# Patient Record
Sex: Male | Born: 1980 | Race: Black or African American | Hispanic: No | Marital: Married | Smoking: Never smoker
Health system: Southern US, Community
[De-identification: ages and names within clinical notes are randomized; demographics above are authoritative.]

## PROBLEM LIST (undated history)

## (undated) DIAGNOSIS — M069 Rheumatoid arthritis, unspecified: Secondary | ICD-10-CM

## (undated) DIAGNOSIS — G629 Polyneuropathy, unspecified: Secondary | ICD-10-CM

## (undated) HISTORY — DX: Polyneuropathy, unspecified: G62.9

## (undated) HISTORY — DX: Rheumatoid arthritis, unspecified: M06.9

---

## 2000-05-27 ENCOUNTER — Emergency Department (HOSPITAL_COMMUNITY): Admission: EM | Admit: 2000-05-27 | Discharge: 2000-05-27 | Payer: Self-pay | Admitting: Emergency Medicine

## 2001-04-30 ENCOUNTER — Emergency Department (HOSPITAL_COMMUNITY): Admission: EM | Admit: 2001-04-30 | Discharge: 2001-04-30 | Payer: Self-pay | Admitting: Emergency Medicine

## 2001-05-08 ENCOUNTER — Emergency Department (HOSPITAL_COMMUNITY): Admission: EM | Admit: 2001-05-08 | Discharge: 2001-05-08 | Payer: Self-pay | Admitting: *Deleted

## 2002-11-20 ENCOUNTER — Emergency Department (HOSPITAL_COMMUNITY): Admission: EM | Admit: 2002-11-20 | Discharge: 2002-11-20 | Payer: Self-pay | Admitting: Emergency Medicine

## 2003-12-07 ENCOUNTER — Emergency Department (HOSPITAL_COMMUNITY): Admission: EM | Admit: 2003-12-07 | Discharge: 2003-12-07 | Payer: Self-pay | Admitting: Emergency Medicine

## 2007-10-12 ENCOUNTER — Emergency Department (HOSPITAL_COMMUNITY): Admission: EM | Admit: 2007-10-12 | Discharge: 2007-10-12 | Payer: Self-pay | Admitting: Emergency Medicine

## 2010-11-27 ENCOUNTER — Emergency Department (HOSPITAL_COMMUNITY)
Admission: EM | Admit: 2010-11-27 | Discharge: 2010-11-27 | Disposition: A | Discharge status: Home / Self Care | Payer: BC Managed Care – PPO | Attending: Emergency Medicine | Admitting: Emergency Medicine

## 2010-11-27 DIAGNOSIS — M109 Gout, unspecified: Secondary | ICD-10-CM | POA: Insufficient documentation

## 2010-11-27 LAB — POCT I-STAT, CHEM 8
BUN: 12 mg/dL (ref 6–23)
Calcium, Ion: 1.09 mmol/L — ABNORMAL LOW (ref 1.12–1.32)
Chloride: 104 mEq/L (ref 96–112)
Creatinine, Ser: 1.2 mg/dL (ref 0.4–1.5)
Glucose, Bld: 107 mg/dL — ABNORMAL HIGH (ref 70–99)
HCT: 46 % (ref 39.0–52.0)

## 2011-09-01 ENCOUNTER — Emergency Department (INDEPENDENT_AMBULATORY_CARE_PROVIDER_SITE_OTHER): Payer: BC Managed Care – PPO

## 2011-09-01 ENCOUNTER — Emergency Department (HOSPITAL_COMMUNITY)
Admission: EM | Admit: 2011-09-01 | Discharge: 2011-09-01 | Disposition: A | Discharge status: Home / Self Care | Payer: BC Managed Care – PPO | Source: Home / Self Care | Attending: Emergency Medicine | Admitting: Emergency Medicine

## 2011-09-01 DIAGNOSIS — S93409A Sprain of unspecified ligament of unspecified ankle, initial encounter: Secondary | ICD-10-CM

## 2011-09-01 MED ORDER — IBUPROFEN 800 MG PO TABS: 800.0000 mg | ORAL_TABLET | Freq: Three times a day (TID) | ORAL | Status: AC

## 2011-09-01 NOTE — ED Notes (Signed)
States he missed last step 3 days ago, and twisted left ankle; c/o pain lateral aspect w marked swelling; denies other trauma. + dorsal pedal pulse; has used RICE w/o improvement

## 2011-09-01 NOTE — ED Provider Notes (Signed)
History     CSN: [id number]  Arrival date & time 09/01/11  0909   First MD Initiated Contact with Patient 09/01/11 1013      Chief Complaint  Patient presents with  . Leg Injury    (Consider location/radiation/quality/duration/timing/severity/associated sxs/prior treatment) HPI Comments: Missed a last step and twisted my left ankle, been hurting and getting swollen for the last 3 days, hurst to walk on it  Patient is a 31 y.o. male presenting with ankle pain.  Ankle Pain  The incident occurred 2 days ago. The injury mechanism was torsion. The pain is present in the left ankle. The pain is at a severity of 6/10. The pain is moderate. The pain has been constant since onset. Associated symptoms include inability to bear weight and loss of motion. Pertinent negatives include no numbness, no muscle weakness, no loss of sensation and no tingling. The symptoms are aggravated by activity. He has tried NSAIDs for the symptoms. The treatment provided moderate relief.    History reviewed. No pertinent past medical history.  History reviewed. No pertinent past surgical history.  History reviewed. No pertinent family history.  History  Substance Use Topics  . Smoking status: Never Smoker   . Smokeless tobacco: Not on file  . Alcohol Use: No      Review of Systems  Musculoskeletal: Positive for joint swelling.  Neurological: Negative for tingling, weakness and numbness.    Allergies  Review of patient's allergies indicates no known allergies.  Home Medications   Current Outpatient Rx  Name Route Sig Dispense Refill  . IBUPROFEN 800 MG PO TABS Oral Take 1 tablet (800 mg total) by mouth 3 (three) times daily. 21 tablet 0    BP 134/71  Pulse 80  Temp(Src) 98.1 F (36.7 C) (Oral)  Resp 17  SpO2 97%  Physical Exam  Nursing note and vitals reviewed. Constitutional: He appears well-developed and well-nourished.  Musculoskeletal:       Left ankle: He exhibits decreased range  of motion and swelling. He exhibits no deformity. tenderness. Lateral malleolus tenderness found. No head of 5th metatarsal tenderness found.       Feet:  Neurological: He is alert.  Skin: No erythema.    ED Course  Procedures (including critical care time)  Labs Reviewed - No data to display Dg Ankle Complete Left  09/01/2011  *RADIOLOGY REPORT*  Clinical Data: Lateral ankle pain and swelling following twisting injury 5 days ago.  LEFT ANKLE COMPLETE - 3+ VIEW  Comparison: None.  Findings: There is marked lateral soft tissue swelling.  The mineralization and alignment are normal.  Minimal ossific density projects over the talofibular joint on the AP view.  This does not appear acute.  There is no evidence of acute fracture or dislocation.  Small plantar calcaneal spur is noted.  IMPRESSION: Lateral soft tissue swelling.  No acute osseous findings.  Original Report Authenticated By: Gerrianne Scale, M.D.     1. Ankle sprain and strain       MDM  Ankle stain 3 days        Jimmie Molly, MD 09/01/11 [phone]

## 2013-06-20 IMAGING — CR DG ANKLE COMPLETE 3+V*L*
3 series · 3 of 3 positions shown · non-contrast
Comparison: None.

CLINICAL DATA: Lateral ankle pain and swelling following twisting
injury 5 days ago.

LEFT ANKLE COMPLETE - 3+ VIEW

[view not recorded (1 of 3)]
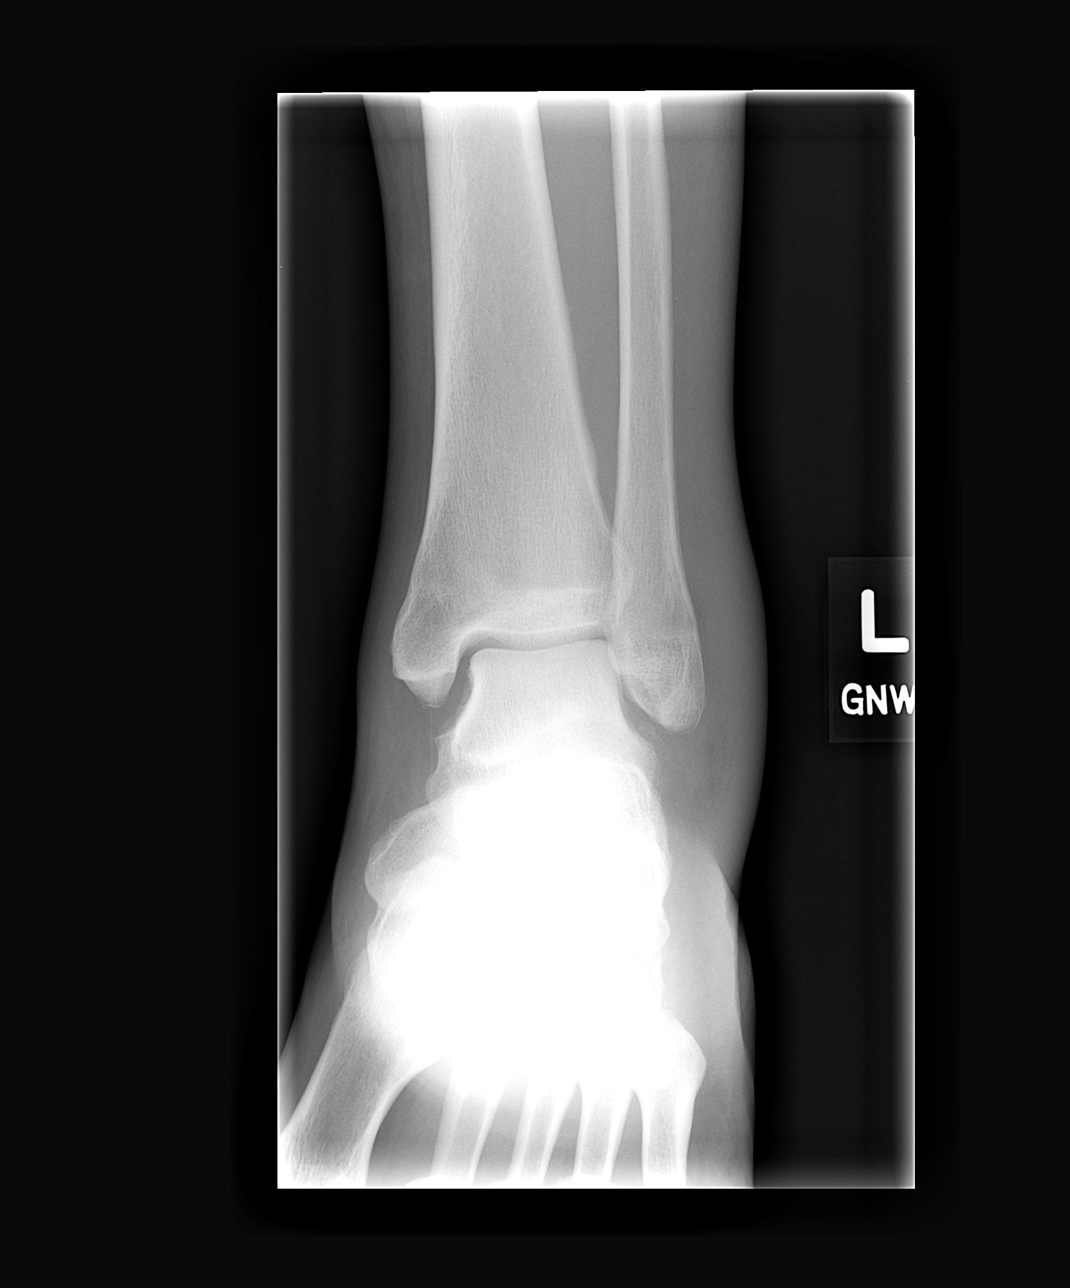

[view not recorded (2 of 3)]
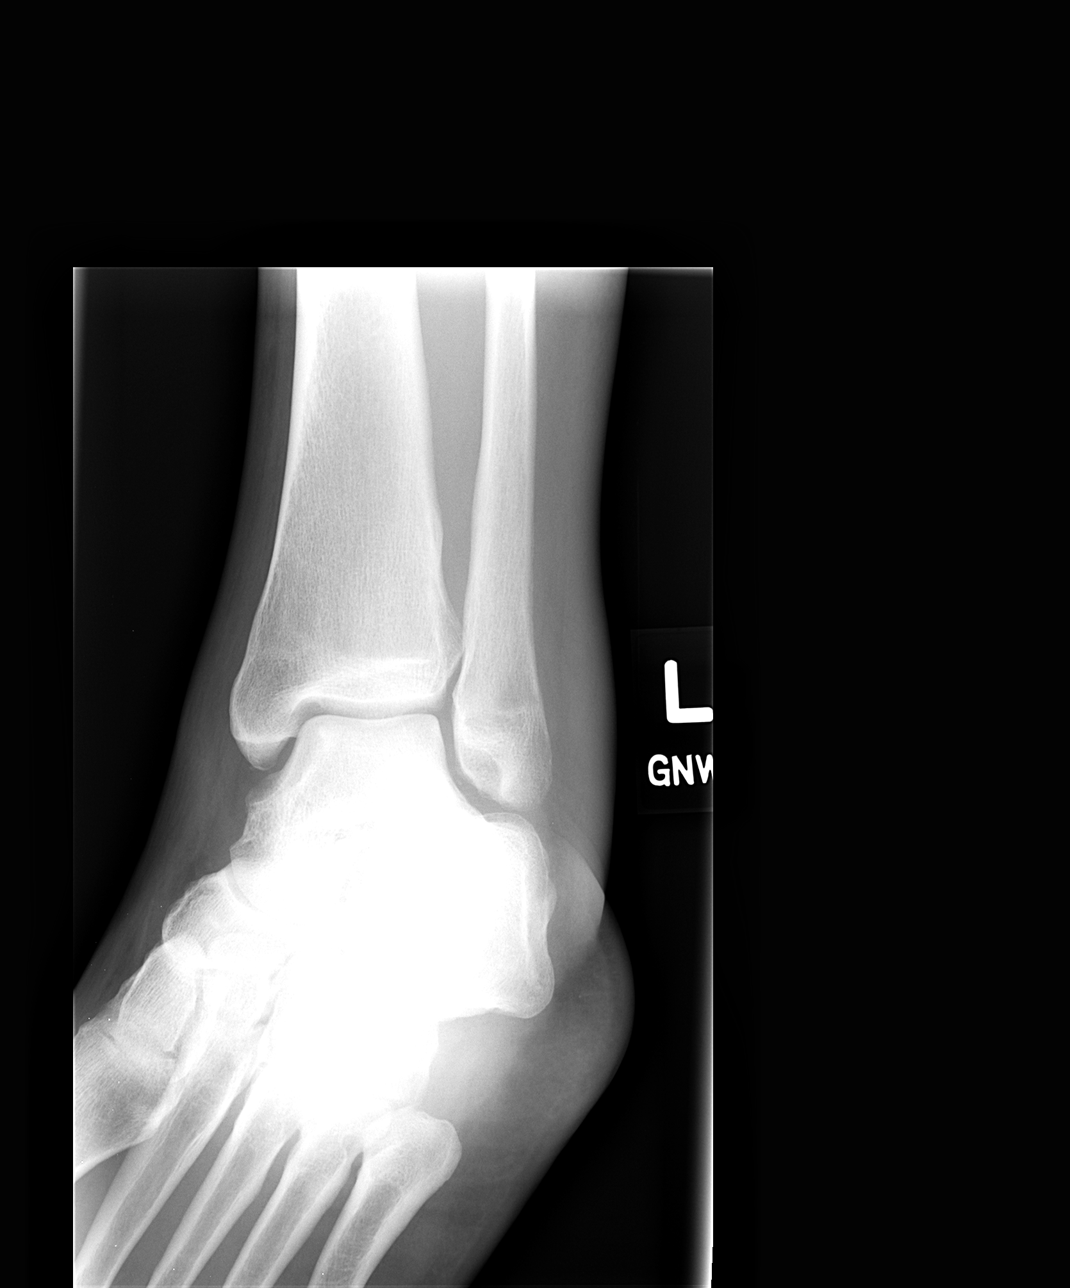

[view not recorded (3 of 3)]
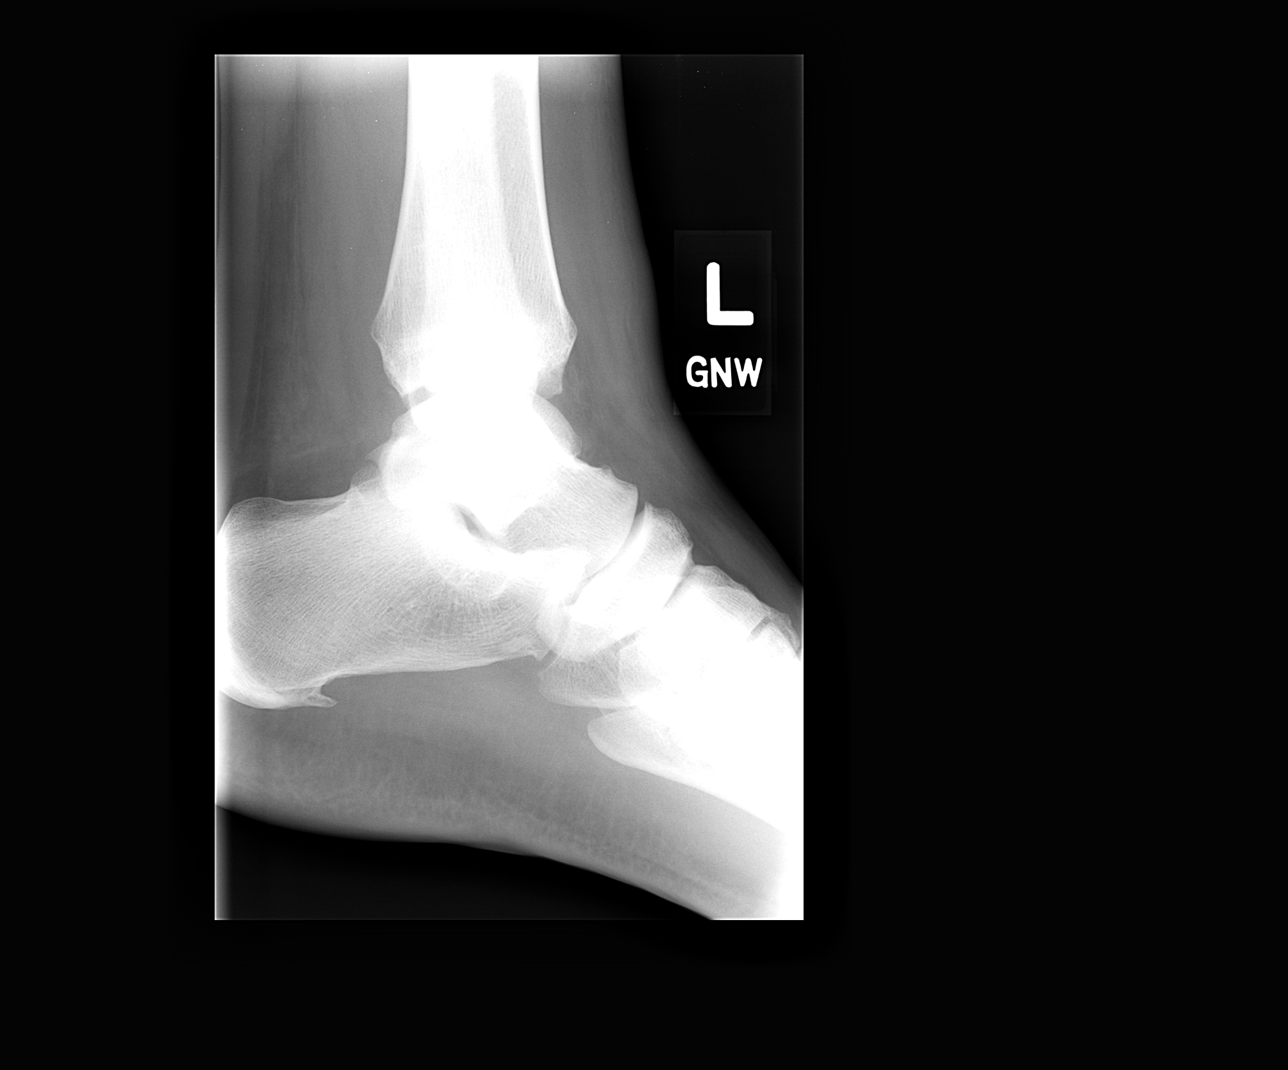

[3 of 3 positions shown; findings below may reference images not displayed]

FINDINGS: There is marked lateral soft tissue swelling.  The
mineralization and alignment are normal.  Minimal ossific density
projects over the talofibular joint on the AP view.  This does not
appear acute.  There is no evidence of acute fracture or
dislocation.  Small plantar calcaneal spur is noted.
IMPRESSION: Lateral soft tissue swelling.  No acute osseous findings.

## 2020-12-30 NOTE — Progress Notes (Signed)
NEUROLOGY CONSULTATION NOTE  Mark Gay MRN: [id number] DOB: [birth year]  Referring provider: Elpidio Anis, PA-C Primary care provider: [facility]  Reason for consult:  Neuropathy, Lhermitte's sign  Assessment/Plan:   1.  Paresthesias involving upper and lower extremities - concern for cervical myelopathy - rule out spinal stenosis, mass lesion or demyelinating disease.  He does have rheumatoid arthritis and therefore may have an additional autoimmune disease affecting the CNS. 2.  Rheumatoid arthritis.  1.  MRI of brain and cervical spine with and without contrast 2.  Further recommendations pending results.  Follow up after testing.  Subjective:  Mark Gay is a 40 year old right-handed male with rheumatoid arthritis who present for neuropathy and Lhermitte's sign.  History supplemented by referring provider's note.  Patient has rheumatoid arthritis/gouty arthritis, treated with Enebrel, methotrexate and allopurinol.  Beginning about 3 months ago, he started feeling pins and needles sensation in his right hand.  He would shake it out but it never resolved.  It involves the last three digits and started radiating up the ulnar aspect of his forearm and then above the elbow to the shoulder.  He then noticed numbness and tingling involving the lateral right leg that may radiate to involve the lateral and dorsal aspect of his foot and toes.  Over the past month, he started experiencing paresthesias in his left hand as well.  Sometimes his right upper extremity will cramp and contract, flexed at the elbow, wrist and fingers.  When he extends his neck and looks up with his arms raised, the paresthesias are aggravated.  He has some localized pain in the posterior neck at the C7 level.  No radicular pain down the extremities.  No weakness.  No bowel or bladder dysfunction.  No involvement of the head and face.  No visual disturbance.  Now bowel or bladder  dysfunction.  Due to concerns of possible demyelinating disease, Enebrel was discontinued but his polyarthralgias worsened, so he restarted it. He works for [employer] but does not do heavy lifting.  No preceding injury.    Rheumatoid arthritis runs in his family - his grandfather and mother.  No known family history of MS.   Labs from April were unremarkable including B12 389, TSH 1.980, CBC and CMP.   PAST MEDICAL HISTORY: Rheumatoid arthritis  PAST SURGICAL HISTORY: History reviewed. No pertinent surgical history.   MEDICATIONS: Current Outpatient Medications on File Prior to Visit  Medication Sig Dispense Refill  . celecoxib (CELEBREX) 200 MG capsule Take 200 mg by mouth daily.    Elgie Collard MINI 50 MG/ML SOCT Inject 500 mg into the skin once a week. Every Monday     No current facility-administered medications on file prior to visit.     ALLERGIES: No Known Allergies  FAMILY HISTORY: History reviewed. No pertinent family history.  Objective:  Blood pressure 130/83, pulse 73, height 6\' 2"  (1.88 m), weight 264 lb 6.4 oz (119.9 kg), SpO2 97 %. General: No acute distress.  Patient appears well-groomed.   Head:  Normocephalic/atraumatic Eyes:  fundi examined but not visualized Neck: supple, no paraspinal tenderness, full range of motion Back: No paraspinal tenderness Heart: regular rate and rhythm Lungs: Clear to auscultation bilaterally. Vascular: No carotid bruits. Neurological Exam: Mental status: alert and oriented to person, place, and time; speech fluent and not dysarthric, language intact. Cranial nerves: CN I: not tested CN II: pupils equal, round and reactive to light, visual fields intact CN III, IV,  VI:  full range of motion, no nystagmus, no ptosis CN V: facial sensation intact. CN VII: upper and lower face symmetric CN VIII: hearing intact CN IX, X: gag intact, uvula midline CN XI: sternocleidomastoid and trapezius muscles intact CN XII: tongue midline Bulk &  Tone: normal, no fasciculations. Motor:  muscle strength 5/5 throughout Sensation:  Pinprick sensation reduced over last 3 digits of right hand, ulnar aspect of right hand and forearm, entire right proximal arm above the elbow, dorsal and lateral aspect of right foot and entire right leg.  Vibratory sensation intact.   Deep Tendon Reflexes:  2+ throughout,  toes downgoing.  Questionable slight Hoffman's bilaterally. Finger to nose testing:  Without dysmetria.   Heel to shin:  Without dysmetria.   Gait:  Normal station and stride.  Able to turn, tandem walk, and walk on toes and heals.  Romberg negative.    Thank you for allowing me to take part in the care of this patient.  Shon Millet, DO  CC:  Elpidio Anis, PA-C

## 2020-12-31 ENCOUNTER — Other Ambulatory Visit: Payer: Self-pay

## 2020-12-31 ENCOUNTER — Encounter: Payer: Self-pay | Admitting: Neurology

## 2020-12-31 ENCOUNTER — Ambulatory Visit: Payer: BC Managed Care – PPO | Admitting: Neurology

## 2020-12-31 VITALS — BP 130/83 | HR 73 | Ht 74.0 in | Wt 264.4 lb

## 2020-12-31 DIAGNOSIS — G379 Demyelinating disease of central nervous system, unspecified: Secondary | ICD-10-CM | POA: Diagnosis not present

## 2020-12-31 DIAGNOSIS — R202 Paresthesia of skin: Secondary | ICD-10-CM

## 2020-12-31 DIAGNOSIS — G959 Disease of spinal cord, unspecified: Secondary | ICD-10-CM | POA: Diagnosis not present

## 2020-12-31 DIAGNOSIS — R200 Anesthesia of skin: Secondary | ICD-10-CM | POA: Diagnosis not present

## 2020-12-31 NOTE — Patient Instructions (Signed)
Check MRI of brain and cervical spine with and without contrast Further recommendations pending results.

## 2021-01-13 ENCOUNTER — Telehealth: Payer: Self-pay | Admitting: Neurology

## 2021-01-13 ENCOUNTER — Other Ambulatory Visit: Payer: Self-pay

## 2021-01-13 DIAGNOSIS — G379 Demyelinating disease of central nervous system, unspecified: Secondary | ICD-10-CM

## 2021-01-13 DIAGNOSIS — G959 Disease of spinal cord, unspecified: Secondary | ICD-10-CM

## 2021-01-13 DIAGNOSIS — R202 Paresthesia of skin: Secondary | ICD-10-CM

## 2021-01-13 DIAGNOSIS — R200 Anesthesia of skin: Secondary | ICD-10-CM

## 2021-01-13 NOTE — Telephone Encounter (Signed)
New message    Need a sign MRI order of brain / cervical spine with / without contrast from MD   Appt 5.19.22 @ 9:00am   They received the clinic notes   Please advise.

## 2021-01-13 NOTE — Telephone Encounter (Signed)
Order faxed over through Epic as well via fax machine.

## 2021-01-13 NOTE — Progress Notes (Signed)
Telephone call from [facility] imaging on [facility], Pt would like to have  His imaging there instead of [facility].  New orders added and faxed back [phone] per rep at [facility].

## 2021-01-17 ENCOUNTER — Telehealth: Payer: Self-pay | Admitting: Neurology

## 2021-01-17 DIAGNOSIS — G959 Disease of spinal cord, unspecified: Secondary | ICD-10-CM

## 2021-01-17 DIAGNOSIS — R202 Paresthesia of skin: Secondary | ICD-10-CM

## 2021-01-17 DIAGNOSIS — R200 Anesthesia of skin: Secondary | ICD-10-CM

## 2021-01-17 NOTE — Addendum Note (Signed)
Addended by: Leida Lauth on: 01/17/2021 04:24 PM   Modules accepted: Orders

## 2021-01-17 NOTE — Telephone Encounter (Signed)
Pt advised.   Order faxed to [facility].

## 2021-01-17 NOTE — Telephone Encounter (Signed)
MRI of brain is normal.  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  Please refer patient to neurosurgery.

## 2021-09-11 ENCOUNTER — Ambulatory Visit
Admission: EM | Admit: 2021-09-11 | Discharge: 2021-09-11 | Disposition: A | Discharge status: Home / Self Care | Payer: BC Managed Care – PPO

## 2021-09-11 ENCOUNTER — Ambulatory Visit (INDEPENDENT_AMBULATORY_CARE_PROVIDER_SITE_OTHER): Payer: BC Managed Care – PPO

## 2021-09-11 ENCOUNTER — Other Ambulatory Visit: Payer: Self-pay

## 2021-09-11 ENCOUNTER — Encounter: Payer: Self-pay | Admitting: Emergency Medicine

## 2021-09-11 DIAGNOSIS — S92415A Nondisplaced fracture of proximal phalanx of left great toe, initial encounter for closed fracture: Secondary | ICD-10-CM

## 2021-09-11 DIAGNOSIS — S92425A Nondisplaced fracture of distal phalanx of left great toe, initial encounter for closed fracture: Secondary | ICD-10-CM | POA: Diagnosis not present

## 2021-09-11 NOTE — ED Triage Notes (Addendum)
Fell 1.5 weeks ago, left toe bruised initially. Now toe is very swollen and painful. States he felt a snap initially, but just thought he had popped his toe. Also complaining of pain across the bases of all toes

## 2021-09-11 NOTE — ED Provider Notes (Signed)
[facility]    CSN: [id number] Arrival date & time: 09/11/21  0805      History   Chief Complaint Chief Complaint  Patient presents with   Fall   Leg Injury    HPI Mark Gay is a 41 y.o. male.   Patient presents with left toe/foot pain that started approximately 1.5 weeks ago after an injury.  He reports that he was working on his car when he fell forward and hyperextended his toes on his left foot.  He is able to bear weight but with pain.  He denies pain in the foot, and the majority of the pain is located in the left great toe but does extend to the smaller toes as well.  Denies any numbness or tingling.   Fall   Past Medical History:  Diagnosis Date   Neuropathy    Rheumatoid arthritis (HCC)     There are no problems to display for this patient.   History reviewed. No pertinent surgical history.     Home Medications    Prior to Admission medications   Medication Sig Start Date End Date Taking? Authorizing Provider  ENBREL MINI 50 MG/ML SOCT Inject 500 mg into the skin once a week. Every Monday 10/27/20  Yes [provider]  Methotrexate, PF, (RASUVO) 20 MG/0.4ML SOAJ INJECT ONE PEN UNDER THE SKIN ONCE EVERY WEEK. STORE AT ROOM TEMPERATURE BETWEEN 68 - 77 DEGREES F. 10/27/20  Yes [provider]  celecoxib (CELEBREX) 200 MG capsule Take 200 mg by mouth daily. 12/27/20   [provider]    Family History History reviewed. No pertinent family history.  Social History Social History   Tobacco Use   Smoking status: Never   Smokeless tobacco: Never  Substance Use Topics   Alcohol use: Yes    Comment: Occ.   Drug use: No     Allergies   Patient has no known allergies.   Review of Systems Review of Systems Per HPI  Physical Exam Triage Vital Signs ED Triage Vitals  Enc Vitals Group     BP 09/11/21 0822 135/89     Pulse Rate 09/11/21 0822 74     Resp 09/11/21 0822 16     Temp 09/11/21 0822 98.1 F (36.7  C)     Temp Source 09/11/21 0822 Oral     SpO2 09/11/21 0822 95 %     Weight --      Height --      Head Circumference --      Peak Flow --      Pain Score 09/11/21 0824 8     Pain Loc --      Pain Edu? --      Excl. in GC? --    No data found.  Updated Vital Signs BP 135/89 (BP Location: Left Arm)    Pulse 74    Temp 98.1 F (36.7 C) (Oral)    Resp 16    SpO2 95%   Visual Acuity Right Eye Distance:   Left Eye Distance:   Bilateral Distance:    Right Eye Near:   Left Eye Near:    Bilateral Near:     Physical Exam Constitutional:      General: He is not in acute distress.    Appearance: Normal appearance. He is not toxic-appearing or diaphoretic.  HENT:     Head: Normocephalic and atraumatic.  Eyes:     Extraocular Movements: Extraocular movements intact.  Conjunctiva/sclera: Conjunctivae normal.  Pulmonary:     Effort: Pulmonary effort is normal.  Musculoskeletal:     Right foot: Normal.     Left foot: Decreased range of motion. Normal capillary refill. Swelling, tenderness and bony tenderness present. No crepitus. Normal pulse.     Comments: Tenderness to palpation to left great toe and second and third toes of left foot.  Bruising noted to dorsal surface of left great toe with mild swelling.  Neurovascular intact.  Limited range of motion of toes given pain.  Capillary refill normal.  Neurological:     General: No focal deficit present.     Mental Status: He is alert and oriented to person, place, and time. Mental status is at baseline.  Psychiatric:        Mood and Affect: Mood normal.        Behavior: Behavior normal.        Thought Content: Thought content normal.        Judgment: Judgment normal.     UC Treatments / Results  Labs (all labs ordered are listed, but only abnormal results are displayed) Labs Reviewed - No data to display  EKG   Radiology DG Foot Complete Left  Result Date: 09/11/2021 CLINICAL DATA:  Fall 1.5 weeks prior with  persistent left first toe bruising, swelling and pain EXAM: LEFT FOOT - COMPLETE 3+ VIEW COMPARISON:  None. FINDINGS: Comminuted nondisplaced intra-articular fracture of the distal aspect of the proximal phalanx in the left first toe with surrounding soft tissue swelling. No additional fracture. No dislocation. Lisfranc joint appears intact. Small to moderate plantar left calcaneal spur. Mild first MTP joint osteoarthritis. No radiopaque foreign bodies. IMPRESSION: 1. Comminuted nondisplaced intra-articular fracture of the distal aspect of the proximal phalanx in the left first toe. No dislocation. 2. Small to moderate plantar left calcaneal spur. 3. Mild first MTP joint osteoarthritis. Electronically Signed   By: Ilona Sorrel M.D.   On: 09/11/2021 08:53    Procedures Procedures (including critical care time)  Medications Ordered in UC Medications - No data to display  Initial Impression / Assessment and Plan / UC Course  I have reviewed the triage vital signs and the nursing notes.  Pertinent labs & imaging results that were available during my care of the patient were reviewed by me and considered in my medical decision making (see chart for details).     Left foot x-ray did show fracture of left great toe. Dr. Mardelle Matte (on-call orthopedist) was called to consult given location of fracture and comminuted characteristic.  Orthopedist advised that he does not need to be seen as soon as possible but he can follow-up in the next few days outpatient.  Postop shoe applied due to fracture being in left great toe.  Advised supportive care and ice application.  Patient to follow-up with provided contact information for orthopedist.  Discussed strict return precautions.  Patient verbalized understanding was agreeable with plan. Final Clinical Impressions(s) / UC Diagnoses   Final diagnoses:  Closed nondisplaced fracture of distal phalanx of left great toe, initial encounter     Discharge Instructions       You have a fracture of your left big toe.  Please follow-up with orthopedist for further evaluation and management.     ED Prescriptions   None    PDMP not reviewed this encounter.   Teodora Medici, [place] 09/11/21 [phone]

## 2021-09-11 NOTE — Discharge Instructions (Signed)
You have a fracture of your left big toe.  Please follow-up with orthopedist for further evaluation and management.

## 2021-09-15 ENCOUNTER — Ambulatory Visit (HOSPITAL_BASED_OUTPATIENT_CLINIC_OR_DEPARTMENT_OTHER): Payer: BC Managed Care – PPO | Admitting: Orthopaedic Surgery

## 2021-09-16 ENCOUNTER — Other Ambulatory Visit (HOSPITAL_BASED_OUTPATIENT_CLINIC_OR_DEPARTMENT_OTHER): Payer: Self-pay

## 2021-09-16 ENCOUNTER — Ambulatory Visit (HOSPITAL_BASED_OUTPATIENT_CLINIC_OR_DEPARTMENT_OTHER): Payer: BC Managed Care – PPO | Admitting: Orthopaedic Surgery

## 2021-09-16 ENCOUNTER — Other Ambulatory Visit: Payer: Self-pay

## 2021-09-16 DIAGNOSIS — M21619 Bunion of unspecified foot: Secondary | ICD-10-CM | POA: Diagnosis not present

## 2021-09-16 DIAGNOSIS — S92412A Displaced fracture of proximal phalanx of left great toe, initial encounter for closed fracture: Secondary | ICD-10-CM | POA: Diagnosis not present

## 2021-09-16 MED ORDER — MELOXICAM 15 MG PO TABS
15.0000 mg | ORAL_TABLET | Freq: Every day | ORAL | 0 refills | Status: AC
  Filled 2021-09-16: qty 30, 30d supply, fill #0

## 2021-09-16 NOTE — Progress Notes (Signed)
Chief Complaint: left proximal phalanx fracture     History of Present Illness:    Mark Gay is a 41 y.o. male presents with a left proximal phalanx fracture that occurred on 15 January of this year when he was cleaning his car and tripped and slipped.  He felt a snap in the left toe at that point.  He does have a history of rheumatoid arthritis.  He was given a postop shoe at the urgent care which he says helped significantly.  At this time he has been taking ibuprofen.  That helped significantly with pain.  He works with [employer] and has had to be out of work at this time as result of this injury    Surgical History:   None  PMH/PSH/Family History/Social History/Meds/Allergies:    Past Medical History:  Diagnosis Date   Neuropathy    Rheumatoid arthritis (HCC)    No past surgical history on file. Social History   Socioeconomic History   Marital status: Married    Spouse name: Not on file   Number of children: Not on file   Years of education: Not on file   Highest education level: Not on file  Occupational History   Not on file  Tobacco Use   Smoking status: Never   Smokeless tobacco: Never  Substance and Sexual Activity   Alcohol use: Yes    Comment: Occ.   Drug use: No   Sexual activity: Not on file  Other Topics Concern   Not on file  Social History Narrative   Right handed   Social Determinants of Health   Financial Resource Strain: Not on file  Food Insecurity: Not on file  Transportation Needs: Not on file  Physical Activity: Not on file  Stress: Not on file  Social Connections: Not on file   No family history on file. No Known Allergies Current Outpatient Medications  Medication Sig Dispense Refill   celecoxib (CELEBREX) 200 MG capsule Take 200 mg by mouth daily.     ENBREL MINI 50 MG/ML SOCT Inject 500 mg into the skin once a week. Every Monday     Methotrexate, PF, (RASUVO) 20 MG/0.4ML SOAJ INJECT ONE PEN  UNDER THE SKIN ONCE EVERY WEEK. STORE AT ROOM TEMPERATURE BETWEEN 68 - 77 DEGREES F.     No current facility-administered medications for this visit.   No results found.  Review of Systems:   A ROS was performed including pertinent positives and negatives as documented in the HPI.  Physical Exam :   Constitutional: NAD and appears stated age Neurological: Alert and oriented Psych: Appropriate affect and cooperative There were no vitals taken for this visit.   Comprehensive Musculoskeletal Exam:    Tenderness to palpation about left great toe.  There is a baseline mild hallux valgus deformity.  Sensation is intact in all distributions of the left foot.  Tenderness with mobility.  He has a baseline right great toe hallux valgus deformity as well this is more severe in the left side  Imaging:   Xray (3 views left foot): Left minimally displaced proximal phalanx fracture with intra-articular extension  I personally reviewed and interpreted the radiographs.   Assessment:   41 year old male with left great toe proximal phalanx fracture.  At this time I will do believe the alignment  is quite good and that this will heal well without surgery.  I would recommend him continuing to weight-bear as tolerated on this left side.  He was advised on buddy taping.  I will also prescribe him Mobic as he does not like to take pills multiple times daily.  I will see him back in 2 weeks and we will reassess for possible return to work at that time  Plan :    -I will see him back in 2 weeks for reassessment and possible return to work     I personally saw and evaluated the patient, and participated in the management and treatment plan.  Huel Cote, MD Attending Physician, Orthopedic Surgery  This document was dictated using Dragon voice recognition software. A reasonable attempt at proof reading has been made to minimize errors.

## 2021-09-30 ENCOUNTER — Ambulatory Visit (HOSPITAL_BASED_OUTPATIENT_CLINIC_OR_DEPARTMENT_OTHER): Payer: BC Managed Care – PPO | Admitting: Orthopaedic Surgery

## 2021-09-30 ENCOUNTER — Other Ambulatory Visit (HOSPITAL_BASED_OUTPATIENT_CLINIC_OR_DEPARTMENT_OTHER): Payer: Self-pay | Admitting: Orthopaedic Surgery

## 2021-09-30 DIAGNOSIS — S92412A Displaced fracture of proximal phalanx of left great toe, initial encounter for closed fracture: Secondary | ICD-10-CM

## 2021-11-07 ENCOUNTER — Ambulatory Visit: Payer: BC Managed Care – PPO

## 2021-11-07 ENCOUNTER — Encounter: Payer: Self-pay | Admitting: Podiatrist

## 2021-11-07 ENCOUNTER — Other Ambulatory Visit: Payer: Self-pay

## 2021-11-07 ENCOUNTER — Ambulatory Visit: Payer: BC Managed Care – PPO | Admitting: Podiatrist

## 2021-11-07 ENCOUNTER — Ambulatory Visit (INDEPENDENT_AMBULATORY_CARE_PROVIDER_SITE_OTHER): Payer: BC Managed Care – PPO

## 2021-11-07 DIAGNOSIS — S99922A Unspecified injury of left foot, initial encounter: Secondary | ICD-10-CM

## 2021-11-07 DIAGNOSIS — M21611 Bunion of right foot: Secondary | ICD-10-CM

## 2021-11-07 DIAGNOSIS — M2041 Other hammer toe(s) (acquired), right foot: Secondary | ICD-10-CM

## 2021-11-07 DIAGNOSIS — S92912A Unspecified fracture of left toe(s), initial encounter for closed fracture: Secondary | ICD-10-CM

## 2021-11-07 DIAGNOSIS — M21619 Bunion of unspecified foot: Secondary | ICD-10-CM | POA: Diagnosis not present

## 2021-11-07 NOTE — Patient Instructions (Signed)
Toe Fracture ?A toe fracture is a break in one of the toe bones (phalanges). This may happen if you: ?Drop a heavy object on your toe. ?Stub your toe. ?Twist your toe. ?Exercise the same way too much. ?What are the signs or symptoms? ?The main symptoms are swelling and pain in the toe. You may also have: ?Bruising. ?Stiffness. ?Numbness. ?A change in the way the toe looks. ?Broken bones that poke through the skin. ?Blood under the toenail. ?How is this treated? ?Treatments may include: ?Taping the broken toe to a toe that is next to it (buddy taping). ?Wearing a shoe that has a wide, rigid sole to protect the toe and to limit its movement. ?Wearing a cast. ?Surgery. This may be needed if the: ?Pieces of broken bone are out of place. ?Bone pokes through the skin. ?Physical therapy. ?Follow these instructions at home: ?If you have a shoe: ?Wear the shoe as told by your doctor. Remove it only as told by your doctor. ?Loosen the shoe if your toes tingle, become numb, or turn cold and blue. ?Keep the shoe clean and dry. ? ?Managing pain, stiffness, and swelling ? ?Put ice on the injured area if told by your doctor: ?Put ice in a plastic bag. ?Place a towel between your skin and the bag. ?If you have a shoe, remove it as told by your doctor. ?If you have a cast, place a towel between your cast and the bag. ?Leave the ice on for 20 minutes, 2-3 times per day. ?Raise (elevate) the injured area above the level of your heart while you are sitting or lying down. ?General instructions ?If your toe was taped to a toe that is next to it, follow your doctor's instructions for changing the gauze and tape. Change it more often: ?If the gauze and tape get wet. If this happens, dry the space between the toes. ?If the gauze and tape are too tight and they cause your toe to become pale or to lose feeling (go numb). ?If your doctor did not give you a protective shoe, wear sturdy shoes that support your foot. Your shoes should not: ?Pinch  your toes. ?Fit tightly against your toes. ?Do not use any tobacco products, including cigarettes, chewing tobacco, or e-cigarettes. These can delay bone healing. If you need help quitting, ask your doctor. ?Take medicines only as told by your doctor. ?Keep all follow-up visits as told by your doctor. This is important. ?Contact a doctor if: ?Your pain medicine is not helping. ?You have a fever. ?You notice a bad smell coming from your cast. ?Get help right away if: ?You lose feeling (have numbness) in your toe or foot, and it is getting worse. ?Your toe or your foot tingles. ?Your toe or your foot gets cold or turns blue. ?You have redness or swelling in your toe or foot, and it is getting worse. ?You have very bad pain. ?Summary ?A toe fracture is a break in one of the toe bones. ?Use ice and raise your foot. This will help lessen pain and swelling. ?Use crutches as told by your doctor. ?This information is not intended to replace advice given to you by your health care provider. Make sure you discuss any questions you have with your health care provider. ?Document Revised: 01/17/2021 Document Reviewed: 01/17/2021 ?Elsevier Patient Education ? 2022 [employer] ? ?

## 2021-11-07 NOTE — Progress Notes (Signed)
?Chief Complaint  ?Patient presents with  ? Bunions  ?  Biltaeral bunion pain , patient states this has been ongoing for 2 years  ?  ? ?HPI: Patient is 41 y.o. male who presents today for 2 concerns the first concern was a fractured left hallux.  He relates he was cleaning his car and slipped and landed on his foot and his and his toe became bruised and painful.  He was seen on September 11, 2021 and was put in a surgical shoe.  He wore it for about 2 weeks and related that the swelling went down.  He does still have some pain in this left great toe from time to time.  He is currently not wearing a surgical shoe or boot on this foot.  He also has concern over a large bunion on the right foot which she would like to know about treatment options. ?He does have rheumatoid arthritis and is under the treatment by Dr. Azucena Fallen.  He is currently on Enbrel and methotrexate. ? ?There are no problems to display for this patient. ? ? ?Current Outpatient Medications on File Prior to Visit  ?Medication Sig Dispense Refill  ? celecoxib (CELEBREX) 200 MG capsule Take 200 mg by mouth daily.    ? ENBREL MINI 50 MG/ML SOCT Inject 500 mg into the skin once a week. Every Monday    ? meloxicam (MOBIC) 15 MG tablet Take 1 tablet (15 mg total) by mouth daily. 30 tablet 0  ? Methotrexate, PF, (RASUVO) 20 MG/0.4ML SOAJ INJECT ONE PEN UNDER THE SKIN ONCE EVERY WEEK. STORE AT ROOM TEMPERATURE BETWEEN 68 - 77 DEGREES F.    ? ?No current facility-administered medications on file prior to visit.  ? ? ?No Known Allergies ? ?Review of Systems ?No fevers, chills, nausea, muscle aches, no difficulty breathing, no calf pain, no chest pain or shortness of breath. ? ? ?Physical Exam ? ?GENERAL APPEARANCE: Alert, conversant. Appropriately groomed. No acute distress.  ? ?VASCULAR: Pedal pulses palpable DP and PT bilateral.  Capillary refill time is immediate to all digits,  Proximal to distal cooling it warm to warm.  Digital perfusion adequate.   ? ?NEUROLOGIC: sensation is intact to 5.07 monofilament at 5/5 sites bilateral.  Light touch is intact bilateral, vibratory sensation intact bilateral ? ?MUSCULOSKELETAL: Mild pain at the hallux interphalangeal joint left is noted with range of motion.  A moderate bunion deformity is noted on the left foot.  Right foot a large bunion deformity is noted with a contracture second digit hammertoe noted as well.  Range of motion is good with no pain or crepitus. ? ?DERMATOLOGIC: skin is warm, supple, and dry.  No open lesions noted.  No rash, no pre ulcerative lesions. Digital nails are asymptomatic.   ? ?X-ray evaluation 3 views of the left foot are obtained.  A fracture of the proximal phalanx of the left hallux is noted with a slightly displaced distal medial fracture fragment.  Callus is noted to be forming around the central fracture line of the hallux proximal phalanx.  Moderate bunion is also present on the left foot.  Joint spaces are good.  No signs of arthritic changes are noted. ? ?3 views of the right foot are also obtained.  Moderate to severe bunion deformity is noted with atavistic cuneiform noted as well.  Contracture of the second digit with hammertoe deformity is seen.  Joint spaces are good.  No sign of arthritic changes noted. ? ? ?Assessment  ? ?  ICD-10-CM   ?1. Bunion  M21.619 DG Foot Complete Right  ?  ?2. Injury of toe on left foot, initial encounter  F79.024O DG Foot Complete Left  ?  ?3. Fracture of proximal phalanx of toe of left foot  S92.912A   ?  ?4. Bunion, right  M21.611   ?  ?5. Hammertoe of second toe of right foot  M20.41   ?  ? ? ? ?Plan ? ?Discussed exam and x-ray findings with the patient.  Discussed that for the fracture I would recommend putting him in a boot to allow the fracture to consolidate and heal fully.  I discussed that for the bunion he would need surgery and I recommended he speak with Dr. Lilian Kapur.  Dr. Lilian Kapur did speak with the patient at today's visit and he will  see Ron back in 1 month for recheck of the fracture and for follow-up and for surgical planning for the bunion procedure on the right foot.  If any concerns arise prior to that visit he is instructed to call. ?

## 2021-11-07 NOTE — Progress Notes (Signed)
I saw and evaluated the patient today in conjunction with Dr. Irving Shows for a right foot bunion, second hammertoe and left hallux proximal phalanx fracture.  History of RA currently on Enbrel and Rasuvo.  Briefly reviewed his radiographs with him, I would recommend allowing the left hallux fracture to heal further prior to proceeding with bunion surgery.  I will see him back in 4 weeks for surgical planning visit.  Surgically we discussed a Lapidus arthrodesis.  He will need to plan for time off for his work and I estimated 3 to 4 months which should be sufficient.  We will discuss further at his next visit signed consent for paperwork and pick a surgical date as well as take new radiographs of the left hallux. ? ?Sharl Ma, DPM ?11/07/2021 ? ? ?

## 2021-12-05 ENCOUNTER — Ambulatory Visit: Payer: BC Managed Care – PPO | Admitting: Podiatry

## 2021-12-05 ENCOUNTER — Ambulatory Visit (INDEPENDENT_AMBULATORY_CARE_PROVIDER_SITE_OTHER): Payer: BC Managed Care – PPO

## 2021-12-05 DIAGNOSIS — M21961 Unspecified acquired deformity of right lower leg: Secondary | ICD-10-CM

## 2021-12-05 DIAGNOSIS — M2041 Other hammer toe(s) (acquired), right foot: Secondary | ICD-10-CM | POA: Diagnosis not present

## 2021-12-05 DIAGNOSIS — S92912A Unspecified fracture of left toe(s), initial encounter for closed fracture: Secondary | ICD-10-CM

## 2021-12-05 DIAGNOSIS — M2011 Hallux valgus (acquired), right foot: Secondary | ICD-10-CM | POA: Diagnosis not present

## 2021-12-05 DIAGNOSIS — M21611 Bunion of right foot: Secondary | ICD-10-CM

## 2021-12-05 NOTE — Progress Notes (Signed)
?  Subjective:  ?Patient ID: Mark Gay, male    DOB: Apr 29, 1981,  MRN: [id number] ? ?Chief Complaint  ?Patient presents with  ? Bunions  ? Fracture  ?   toe fracture left (need xrays) and pre op bunion consult right.  ? ? ?41 y.o. male presents with the above complaint. History confirmed with patient.  Returns today for follow-up of the left hallux fracture as well as right foot hallux valgus and bunion deformity.  He is ready to have his right foot surgery scheduled.  The left foot continues to improve but is slow progress. ? ?Objective:  ?Physical Exam: ?warm, good capillary refill, no trophic changes or ulcerative lesions, normal DP and PT pulses, and normal sensory exam. ?Left Foot: bunion deformity noted and pain around the great toe ?Right Foot:  Hallux valgus with bunion deformity on the right side there is hallux deviation, second hammertoe that is significant with semirigid nature, adductovarus fifth toe contracture ? ?No images are attached to the encounter. ? ?Radiographs: ?Multiple views x-ray of both feet: New left foot radiographs taken today show increasing bone callus formation around his fracture site with improvement, right foot radiographs previously taken show a significant hallux valgus deformity with metatarsus primus varus hallux abductus, second hammertoe and elongated second metatarsal with adductovarus fifth toe contracture ?Assessment:  ? ?1. Fracture of proximal phalanx of toe of left foot   ?2. Hallux valgus with bunions, right   ?3. Hammertoe of right foot   ?4. Deformity of metatarsal bone of right foot   ? ? ? ?Plan:  ?Patient was evaluated and treated and all questions answered. ? ?For his left hallux fracture continues to improve I would recommend he continue WBAT in the cam boot until his pain is resolved.  We will take new radiographs at the next time I see him. ? ? ?Discussed the etiology and treatment including surgical and non surgical treatment for painful bunions and  hammertoes.  He has exhausted all non surgical treatment prior to this visit including shoe gear changes and padding.  He desires surgical intervention. We discussed all risks including but not limited to: pain, swelling, infection, scar, numbness which may be temporary or permanent, chronic pain, stiffness, nerve pain or damage, wound healing problems, bone healing problems including delayed or non-union and recurrence. Specifically we discussed the following procedures: Right foot Lapidus arthrodesis with bone graft from the heel, possible Akin osteotomy, hammertoe correction of the second and fifth toes with possible Weil osteotomy of the second metatarsal to correct metatarsal deformity. Informed consent was signed today. Surgery will be scheduled at a mutually agreeable date. Information regarding this will be forwarded to our surgery scheduler. ? ? ?Surgical plan: ? ?Procedure: ?-Right foot Lapidus arthrodesis with bone graft from the heel, possible Akin osteotomy, hammertoe correction of the second and fifth toes with possible Weil osteotomy of the second metatarsal ? ?Location: ?-[facility] ? ?Anesthesia plan: ?-IV sedation with a regional block ? ?Postoperative pain plan: ?- Tylenol 1000 mg every 6 hours, ibuprofen 600 mg every 6 hours, gabapentin 300 mg every 8 hours x5 days, oxycodone 5 mg 1-2 tabs every 6 hours only as needed ? ?DVT prophylaxis: ?-ASA 325 mg twice daily ? ?WB Restrictions / DME needs: ?-NWB in CAM boot which he has currently ? ?No follow-ups on file.  ? ?

## 2021-12-19 DIAGNOSIS — M79676 Pain in unspecified toe(s): Secondary | ICD-10-CM

## 2022-01-04 ENCOUNTER — Telehealth: Payer: Self-pay | Admitting: Urology

## 2022-01-04 NOTE — Telephone Encounter (Signed)
DOS - 02/03/22 ? ?[employer] BUNIONECTOMY RIGHT --- [phone] ?[facility] OSTEOTOMY RIGHT --- [phone] ?METATARSAL OSTEOTOMY 2ND RIGHT --- [phone] ?HAMMERTOE REPAIR 2,5 RIGHT --- 28285 ? ? ?[employer] EFFECTIVE DATE - 01/26/13 ? ?PLAN DEDUCTIBLE - $100.00 W/ $32.33 REMAINING ?OUT OF POCKET - $1,000.00 ?COINSURANCE - 0% ?COPAY - $0.00 ? ?SPOKE WITH DIANE WITH [employer] AND SHE STATED THAT FOR CPT CODES 78469, [phone], [phone] AND [phone] NO PRIOR AUTH IS REQUIRED. ? ?REF # DIANE B. 01/04/22 ?

## 2022-02-03 ENCOUNTER — Other Ambulatory Visit: Payer: Self-pay | Admitting: Podiatry

## 2022-02-03 DIAGNOSIS — M2011 Hallux valgus (acquired), right foot: Secondary | ICD-10-CM | POA: Diagnosis not present

## 2022-02-03 DIAGNOSIS — M2041 Other hammer toe(s) (acquired), right foot: Secondary | ICD-10-CM | POA: Diagnosis not present

## 2022-02-03 MED ORDER — ACETAMINOPHEN 500 MG PO TABS: 1000.0000 mg | ORAL_TABLET | Freq: Four times a day (QID) | ORAL | 0 refills | Status: AC | PRN

## 2022-02-03 MED ORDER — GABAPENTIN 300 MG PO CAPS: 300.0000 mg | ORAL_CAPSULE | Freq: Three times a day (TID) | ORAL | 0 refills | Status: AC

## 2022-02-03 MED ORDER — OXYCODONE HCL 5 MG PO TABS: 5.0000 mg | ORAL_TABLET | ORAL | 0 refills | Status: AC | PRN

## 2022-02-03 MED ORDER — IBUPROFEN 600 MG PO TABS: 600.0000 mg | ORAL_TABLET | Freq: Four times a day (QID) | ORAL | 0 refills | Status: AC | PRN

## 2022-02-03 NOTE — Progress Notes (Signed)
02/03/22 right foot bunion correction, 2nd  and 5th hammertoe correction

## 2022-02-09 ENCOUNTER — Telehealth: Payer: Self-pay

## 2022-02-09 ENCOUNTER — Ambulatory Visit (INDEPENDENT_AMBULATORY_CARE_PROVIDER_SITE_OTHER): Payer: BC Managed Care – PPO

## 2022-02-09 ENCOUNTER — Ambulatory Visit (INDEPENDENT_AMBULATORY_CARE_PROVIDER_SITE_OTHER): Payer: BC Managed Care – PPO | Admitting: Podiatry

## 2022-02-09 DIAGNOSIS — M2041 Other hammer toe(s) (acquired), right foot: Secondary | ICD-10-CM

## 2022-02-09 DIAGNOSIS — M21611 Bunion of right foot: Secondary | ICD-10-CM

## 2022-02-09 DIAGNOSIS — Z9889 Other specified postprocedural states: Secondary | ICD-10-CM

## 2022-02-09 DIAGNOSIS — M79676 Pain in unspecified toe(s): Secondary | ICD-10-CM

## 2022-02-09 DIAGNOSIS — Z09 Encounter for follow-up examination after completed treatment for conditions other than malignant neoplasm: Secondary | ICD-10-CM

## 2022-02-09 DIAGNOSIS — M2011 Hallux valgus (acquired), right foot: Secondary | ICD-10-CM

## 2022-02-09 NOTE — Telephone Encounter (Signed)
Placed order for knee scooter with [employer]. Notified Ron that someone will be in contact with him today about the scooter.

## 2022-02-11 NOTE — Progress Notes (Signed)
Subjective: Mark Gay is a 41 y.o. is seen today in office s/p right foot Lapidus bunionectomy, Akin, hammertoe repair performed on 10/06/2021 with Dr. Lilian Kapur.  States that he is doing well and his pain is controlled.  Can wear the cam boot and staying off the foot is much as possible.  Denies any fevers or chills.  No chest pain shortness of breath.  No other concerns.  Objective: General: No acute distress, AAOx3  DP/PT pulses palpable 2/4, CRT < 3 sec to all digits.  Protective sensation intact. Motor function intact.  Right foot: Incision is well coapted without any evidence of dehiscence. There is no surrounding erythema, ascending cellulitis, fluctuance, crepitus, malodor, drainage/purulence. There is mild to moderate edema around the surgical site. There is mild pain along the surgical site.  Toes in hospitalization. No other areas of tenderness to bilateral lower extremities.  No other open lesions or pre-ulcerative lesions.  No pain with calf compression, swelling, warmth, erythema.   Assessment and Plan:  Status post right foot surgery, doing well with no complications   -Treatment options discussed including all alternatives, risks, and complications -X-rays were obtained and reviewed.  3 views of the right foot were obtained.  No evidence of acute fracture.  Hardware intact without any complicating factors. -Incisions are clean.  Small amount of antibiotic ointment and a bandage applied.  He can keep the dressing clean, dry, intact.  Discussed if needed he can change the penis but still hold off on getting it wet for now. -Continue cam boot, nonweightbearing instructed by Dr. Lilian Kapur. -Ice/elevation -Pain medication as needed. -Knee scooter ordered at his request. -Monitor for any clinical signs or symptoms of infection and DVT/PE and directed to call the office immediately should any occur or go to the ER. -Follow-up as scheduled or sooner if any problems arise. In the  meantime, encouraged to call the office with any questions, concerns, change in symptoms.   Ovid Curd, DPM

## 2022-02-23 ENCOUNTER — Ambulatory Visit (INDEPENDENT_AMBULATORY_CARE_PROVIDER_SITE_OTHER): Payer: BC Managed Care – PPO | Admitting: Podiatry

## 2022-02-23 DIAGNOSIS — M2011 Hallux valgus (acquired), right foot: Secondary | ICD-10-CM

## 2022-02-23 DIAGNOSIS — M2041 Other hammer toe(s) (acquired), right foot: Secondary | ICD-10-CM

## 2022-02-23 DIAGNOSIS — M21611 Bunion of right foot: Secondary | ICD-10-CM

## 2022-02-23 DIAGNOSIS — Z9889 Other specified postprocedural states: Secondary | ICD-10-CM

## 2022-02-23 DIAGNOSIS — M21961 Unspecified acquired deformity of right lower leg: Secondary | ICD-10-CM

## 2022-02-23 NOTE — Progress Notes (Signed)
  Subjective:  Patient ID: Mark Gay, male    DOB: 1981/03/28,  MRN: [id number]  Chief Complaint  Patient presents with   Routine Post Op     POV #2 DOS 02/03/2022 BUNION CORRECTION RT FOOT (LAPIDUS/AIKEN PROCEDURE), 2ND & 5TH TOE CORRECTION, POSS BONE OUT IN 2ND METATARSAL, BONE GRAFT FROM HEEL     41 y.o. male returns for post-op check.  Overall doing well still swollen his pain is improved quite a bit  Review of Systems: Negative except as noted in the HPI. Denies N/V/F/Ch.   Objective:  There were no vitals filed for this visit. There is no height or weight on file to calculate BMI. Constitutional Well developed. Well nourished.  Vascular Foot warm and well perfused. Capillary refill normal to all digits.  Calf is soft and supple, no posterior calf or knee pain, negative Homans' sign  Neurologic Normal speech. Oriented to person, place, and time. Epicritic sensation to light touch grossly present bilaterally.  Dermatologic Skin healing well without signs of infection. Skin edges well coapted without signs of infection.  Orthopedic: Tenderness to palpation noted about the surgical site.   Multiple view plain film radiographs: Radiographs from last visit were reviewed he has good correction noted no complication of hardware which is intact and stable and equivalent to immediate postoperative films Assessment:   1. Hallux valgus with bunions, right   2. Hammertoe of right foot   3. Deformity of metatarsal bone of right foot   4. Status post right foot surgery    Plan:  Patient was evaluated and treated and all questions answered.  S/p foot surgery right -Progressing as expected post-operatively. -XR: New x-rays next visit -WB Status: On Monday may begin WBAT in CAM boot -Sutures: Plan to remove in 2 weeks. -Medications: No refills required -Foot redressed.  Return in about 3 weeks (around 03/16/2022) for post op (new x-rays) pin removal.

## 2022-03-16 ENCOUNTER — Ambulatory Visit (INDEPENDENT_AMBULATORY_CARE_PROVIDER_SITE_OTHER): Payer: BC Managed Care – PPO | Admitting: Podiatry

## 2022-03-16 ENCOUNTER — Ambulatory Visit (INDEPENDENT_AMBULATORY_CARE_PROVIDER_SITE_OTHER): Payer: BC Managed Care – PPO

## 2022-03-16 DIAGNOSIS — M21961 Unspecified acquired deformity of right lower leg: Secondary | ICD-10-CM

## 2022-03-16 DIAGNOSIS — M2041 Other hammer toe(s) (acquired), right foot: Secondary | ICD-10-CM | POA: Diagnosis not present

## 2022-03-16 DIAGNOSIS — M2011 Hallux valgus (acquired), right foot: Secondary | ICD-10-CM

## 2022-03-16 DIAGNOSIS — M21611 Bunion of right foot: Secondary | ICD-10-CM

## 2022-03-16 NOTE — Progress Notes (Signed)
  Subjective:  Patient ID: Mark Gay, male    DOB: January 30, 1981,  MRN: [id number]  Chief Complaint  Patient presents with   Routine Post Op    )PIN REMOVAL POV #3 DOS 02/03/2022 BUNION CORRECTION RT FOOT (LAPIDUS/AIKEN PROCEDURE), 2ND & 5TH TOE CORRECTION, POSS BONE OUT IN 2ND METATARSAL, BONE GRAFT FROM HEEL     41 y.o. male returns for post-op check.  Still swollen he has been on some he says  Review of Systems: Negative except as noted in the HPI. Denies N/V/F/Ch.   Objective:  There were no vitals filed for this visit. There is no height or weight on file to calculate BMI. Constitutional Well developed. Well nourished.  Vascular Foot warm and well perfused. Capillary refill normal to all digits.  Calf is soft and supple, no posterior calf or knee pain, negative Homans' sign  Neurologic Normal speech. Oriented to person, place, and time. Epicritic sensation to light touch grossly present bilaterally.  Dermatologic Incision is well-healed and hypertrophic  Orthopedic: Tenderness to palpation noted about the surgical site.  Most of his pain is distally around the metatarsal heads.  Minimal pain at the first interspace or Lapidus fusion site.  Good range of motion of the MPJ.  Moderate edema   Multiple view plain film radiographs: New radiographs today show unchanged alignment, there is good healing noted across the PIPJ arthrodesis and Aiken osteotomy sites, no significant increased fusion across the first tarsometatarsal joint.  He does have elevation of the first ray but this appears to be anatomically consistent with his preoperative films.  No complication of hardware. Assessment:   1. Hammertoe of second toe of right foot   2. Hallux valgus with bunions, right   3. Deformity of metatarsal bone of right foot    Plan:  Patient was evaluated and treated and all questions answered.  S/p foot surgery right -We reviewed today's radiographs.  The Kirschner wires removed  uneventfully.  He may continue WBAT in the cam boot.  I expect we will need to carry this out for a longer period of time for an additional 4 weeks.  He may have delayed bone healing due to his rheumatoid arthritis and Enbrel.  I will see him back for new x-rays at that point.  Return in about 4 weeks (around 04/13/2022) for post op (new x-rays).

## 2022-04-13 ENCOUNTER — Ambulatory Visit (INDEPENDENT_AMBULATORY_CARE_PROVIDER_SITE_OTHER): Payer: BC Managed Care – PPO | Admitting: Podiatry

## 2022-04-13 ENCOUNTER — Ambulatory Visit (INDEPENDENT_AMBULATORY_CARE_PROVIDER_SITE_OTHER): Payer: BC Managed Care – PPO

## 2022-04-13 DIAGNOSIS — E559 Vitamin D deficiency, unspecified: Secondary | ICD-10-CM

## 2022-04-13 DIAGNOSIS — M21611 Bunion of right foot: Secondary | ICD-10-CM

## 2022-04-13 DIAGNOSIS — M2011 Hallux valgus (acquired), right foot: Secondary | ICD-10-CM | POA: Diagnosis not present

## 2022-04-13 NOTE — Progress Notes (Signed)
  Subjective:  Patient ID: Mark Gay, male    DOB: 10-10-1980,  MRN: [id number]  Chief Complaint  Patient presents with   Routine Post Op    POV #4 DOS 02/03/2022 BUNION CORRECTION RT FOOT (LAPIDUS/AIKEN PROCEDURE), 2ND & 5TH TOE CORRECTION, POSS BONE OUT IN 2ND METATARSAL, BONE GRAFT FROM HEEL     41 y.o. male returns for post-op check.  Still swollen he has been on on the foot mostly around the house sometimes dipped down the driveway  Review of Systems: Negative except as noted in the HPI. Denies N/V/F/Ch.   Objective:  There were no vitals filed for this visit. There is no height or weight on file to calculate BMI. Constitutional Well developed. Well nourished.  Vascular Foot warm and well perfused. Capillary refill normal to all digits.  Calf is soft and supple, no posterior calf or knee pain, negative Homans' sign  Neurologic Normal speech. Oriented to person, place, and time. Epicritic sensation to light touch grossly present bilaterally.  Dermatologic Incision is well-healed and hypertrophic  Orthopedic: Tenderness to palpation noted about the surgical site.  Most of his pain is distally around the metatarsal heads.  Still tenderness and edema proximally in the midfoot   Multiple view plain film radiographs: New radiographs today show increasing bone healing appears to be secondary bone callus formation around the [facility] osteotomy as well as the fusion site at the first tarsometatarsal joint, there is fracture of the intercuneiform screw Assessment:   1. Hallux valgus with bunions, right   2. Vitamin D deficiency    Plan:  Patient was evaluated and treated and all questions answered.  S/p foot surgery right -We reviewed today's radiographs.  Unfortunately seems to be showing signs of delayed union.  He did show good soft tissue healing and therefore we did not stop his Enbrel or Rasuvo for much longer, he is back on his full dose, and I discussed with his  rheumatologist about pausing it which they were okay with.  I did recommend checking a vitamin D and calcium level, if this is insufficient we will plan to supplement without pausing his rheumatoid medications, if it is sufficient then we will plan to stop his RA medications.  I advised him to be nonweightbearing using the knee scooter, so far minimal loss of correction noted.  I will see him back in nearly 4 weeks for updated radiographs, if still persistent lucency then we will recommend noninvasive bone stimulation at that point.  Return in about 25 days (around 05/08/2022) for post op (new x-rays).

## 2022-04-17 ENCOUNTER — Other Ambulatory Visit: Payer: Self-pay | Admitting: Podiatry

## 2022-04-18 LAB — VITAMIN D 25 HYDROXY (VIT D DEFICIENCY, FRACTURES): Vit D, 25-Hydroxy: 20.7 ng/mL — ABNORMAL LOW (ref 30.0–100.0)

## 2022-04-18 LAB — CALCIUM: Calcium: 9.8 mg/dL (ref 8.7–10.2)

## 2022-04-18 MED ORDER — VITAMIN D (ERGOCALCIFEROL) 1.25 MG (50000 UNIT) PO CAPS: 50000.0000 [IU] | ORAL_CAPSULE | ORAL | 0 refills | Status: DC

## 2022-04-18 NOTE — Addendum Note (Signed)
Addended byLilian Kapur, Shellie Rogoff R on: 04/18/2022 07:30 AM   Modules accepted: Orders

## 2022-05-08 ENCOUNTER — Encounter: Payer: BC Managed Care – PPO | Admitting: Podiatry

## 2022-05-09 ENCOUNTER — Other Ambulatory Visit: Payer: Self-pay | Admitting: Podiatry

## 2022-05-16 DIAGNOSIS — M79676 Pain in unspecified toe(s): Secondary | ICD-10-CM

## 2022-05-30 ENCOUNTER — Ambulatory Visit (INDEPENDENT_AMBULATORY_CARE_PROVIDER_SITE_OTHER): Payer: BC Managed Care – PPO

## 2022-05-30 ENCOUNTER — Ambulatory Visit (INDEPENDENT_AMBULATORY_CARE_PROVIDER_SITE_OTHER): Payer: BC Managed Care – PPO | Admitting: Podiatry

## 2022-05-30 ENCOUNTER — Encounter: Payer: Self-pay | Admitting: Podiatry

## 2022-05-30 DIAGNOSIS — M960 Pseudarthrosis after fusion or arthrodesis: Secondary | ICD-10-CM | POA: Diagnosis not present

## 2022-05-30 DIAGNOSIS — M21611 Bunion of right foot: Secondary | ICD-10-CM

## 2022-05-30 DIAGNOSIS — M2011 Hallux valgus (acquired), right foot: Secondary | ICD-10-CM

## 2022-06-04 NOTE — Progress Notes (Signed)
  Subjective:  Patient ID: Mark Gay, male    DOB: 04-23-1981,  MRN: [id number]  Chief Complaint  Patient presents with   Routine Post Op    POV #5 DOS 02/03/2022 BUNION CORRECTION RT FOOT (LAPIDUS/AIKEN PROCEDURE), 2ND & 5TH TOE CORRECTION, POSS BONE OUT IN 2ND METATARSAL, BONE GRAFT FROM HEEL     41 y.o. male returns for post-op check.  Still swollen he has had to be on his foot more he is going through divorce and is having to move  Review of Systems: Negative except as noted in the HPI. Denies N/V/F/Ch.   Objective:  There were no vitals filed for this visit. There is no height or weight on file to calculate BMI. Constitutional Well developed. Well nourished.  Vascular Foot warm and well perfused. Capillary refill normal to all digits.  Calf is soft and supple, no posterior calf or knee pain, negative Homans' sign  Neurologic Normal speech. Oriented to person, place, and time. Epicritic sensation to light touch grossly present bilaterally.  Dermatologic Incision is well-healed and hypertrophic  Orthopedic: Tenderness to palpation noted about the surgical site.  Most of his pain is distally around the metatarsal heads.  Still tenderness and edema proximally in the midfoot   Multiple view plain film radiographs: New radiographs today show increasing bone healing appears to be secondary bone callus formation around the [facility] osteotomy as well as the fusion site at the first tarsometatarsal joint, there is fracture of the intercuneiform screw Assessment:   1. Hallux valgus with bunions, right    Plan:  Patient was evaluated and treated and all questions answered.  S/p foot surgery right -We reviewed today's radiographs.  Has continued delayed union.  He is still taking the vitamin D supplement I prescribed.  I recommended we begin noninvasive bone stimulation as well.  There is loss of correction and ultimately expect he likely will need revision at some point but he is not  currently in a social situation that is conducive to this.  Should continue to use the boot.  I will see him back in 1 month.  Referral for bone stimulator was sent  Return in about 1 month (around 06/30/2022) for surgery follow up (new x-rays).

## 2022-06-06 NOTE — Progress Notes (Signed)
Can you get me these ov notes and op notes Dr. Sherryle Lis is referring too please.

## 2022-06-07 NOTE — Progress Notes (Signed)
Ok great and thanks

## 2022-06-08 DIAGNOSIS — M79676 Pain in unspecified toe(s): Secondary | ICD-10-CM

## 2022-06-13 NOTE — Progress Notes (Signed)
DME order has been sent

## 2022-07-04 ENCOUNTER — Encounter: Payer: BC Managed Care – PPO | Admitting: Podiatry

## 2022-07-06 ENCOUNTER — Ambulatory Visit (INDEPENDENT_AMBULATORY_CARE_PROVIDER_SITE_OTHER): Payer: BC Managed Care – PPO

## 2022-07-06 ENCOUNTER — Encounter: Payer: Self-pay | Admitting: Podiatry

## 2022-07-06 ENCOUNTER — Ambulatory Visit (INDEPENDENT_AMBULATORY_CARE_PROVIDER_SITE_OTHER): Payer: BC Managed Care – PPO | Admitting: Podiatry

## 2022-07-06 DIAGNOSIS — M2011 Hallux valgus (acquired), right foot: Secondary | ICD-10-CM | POA: Diagnosis not present

## 2022-07-06 DIAGNOSIS — M21611 Bunion of right foot: Secondary | ICD-10-CM

## 2022-07-06 DIAGNOSIS — E559 Vitamin D deficiency, unspecified: Secondary | ICD-10-CM

## 2022-07-06 DIAGNOSIS — M79676 Pain in unspecified toe(s): Secondary | ICD-10-CM

## 2022-07-09 NOTE — Progress Notes (Signed)
  Subjective:  Patient ID: Mark Gay, male    DOB: 1981/05/16,  MRN: [id number]  Chief Complaint  Patient presents with   Bunions    4 week follow up DOS 02/03/2022 BUNION CORRECTION RT FOOT (LAPIDUS/AIKEN PROCEDURE), 2ND & 5TH TOE CORRECTION, POSS BONE OUT IN 2ND METATARSAL, BONE GRAFT FROM HEEL     41 y.o. male returns for post-op check.  He said it is doing okay  Review of Systems: Negative except as noted in the HPI. Denies N/V/F/Ch.   Objective:  There were no vitals filed for this visit. There is no height or weight on file to calculate BMI. Constitutional Well developed. Well nourished.  Vascular Foot warm and well perfused. Capillary refill normal to all digits.  Calf is soft and supple, no posterior calf or knee pain, negative Homans' sign  Neurologic Normal speech. Oriented to person, place, and time. Epicritic sensation to light touch grossly present bilaterally.  Dermatologic Incision is well-healed and hypertrophic  Orthopedic: Minimal pain in the midfoot.  There is still moderate edema   Multiple view plain film radiographs: New radiographs today show unchanged alignment, broken screws still present, there is continued increasing bone callus formation on the first TMT J Assessment:   1. Hallux valgus with bunions, right   2. Vitamin D deficiency    Plan:  Patient was evaluated and treated and all questions answered.  S/p foot surgery right -We reviewed today's radiographs.  Has continued delayed union.  He is still taking the vitamin D supplement and I recommend we recheck his level in 2 weeks and an order was placed for this.  He just got his bone stimulator yesterday and I showed him how and where to use it.  He will begin using this we will rex-ray it in 6 weeks.  So far no indication that we will need to urgently return to the OR for correction  Return in about 6 weeks (around 08/17/2022) for surgery follow up (new x-rays).

## 2022-08-17 ENCOUNTER — Encounter: Payer: BC Managed Care – PPO | Admitting: Podiatry

## 2022-08-29 ENCOUNTER — Encounter: Payer: BC Managed Care – PPO | Admitting: Podiatry

## 2022-08-31 ENCOUNTER — Ambulatory Visit (INDEPENDENT_AMBULATORY_CARE_PROVIDER_SITE_OTHER): Payer: BC Managed Care – PPO

## 2022-08-31 ENCOUNTER — Ambulatory Visit (INDEPENDENT_AMBULATORY_CARE_PROVIDER_SITE_OTHER): Payer: BC Managed Care – PPO | Admitting: Podiatry

## 2022-08-31 DIAGNOSIS — M960 Pseudarthrosis after fusion or arthrodesis: Secondary | ICD-10-CM | POA: Diagnosis not present

## 2022-08-31 DIAGNOSIS — M21611 Bunion of right foot: Secondary | ICD-10-CM

## 2022-08-31 DIAGNOSIS — E559 Vitamin D deficiency, unspecified: Secondary | ICD-10-CM | POA: Diagnosis not present

## 2022-08-31 DIAGNOSIS — M2011 Hallux valgus (acquired), right foot: Secondary | ICD-10-CM | POA: Diagnosis not present

## 2022-08-31 NOTE — Progress Notes (Signed)
  Subjective:  Patient ID: Mark Gay, male    DOB: Apr 08, 1981,  MRN: [id number]  Chief Complaint  Patient presents with   Bunions    DOS 02/03/2022 BUNION CORRECTION RT FOOT (LAPIDUS/AIKEN PROCEDURE), 2ND & 5TH TOE CORRECTION, POSS BONE OUT IN 2ND METATARSAL, BONE GRAFT FROM HEEL     42 y.o. male returns for post-op check.  He said it is doing much better pain and swelling has reduced  Review of Systems: Negative except as noted in the HPI. Denies N/V/F/Ch.   Objective:  There were no vitals filed for this visit. There is no height or weight on file to calculate BMI. Constitutional Well developed. Well nourished.  Vascular Foot warm and well perfused. Capillary refill normal to all digits.  Calf is soft and supple, no posterior calf or knee pain, negative Homans' sign  Neurologic Normal speech. Oriented to person, place, and time. Epicritic sensation to light touch grossly present bilaterally.  Dermatologic Incision is well-healed and 9 hypertrophic  Orthopedic: Minimal pain in the midfoot.  There is still moderate edema.  Palpable bony spurring   Multiple view plain film radiographs: New radiographs today show unchanged alignment, broken screws still present, there is increasing consolidation especially dorsally and medially Assessment:   1. Hallux valgus with bunions, right   2. Pseudarthrosis after fusion or arthrodesis   3. Vitamin D deficiency    Plan:  Patient was evaluated and treated and all questions answered.  S/p foot surgery right -Doing much better and is helped quite a bit with noninvasive bone stimulation.  I gave him paperwork again for his vitamin D level to be checked and he will do this today.  Transition to surgical shoe and this is dispensed today.  I will see him back in 6 weeks for follow-up and new x-ray  No follow-ups on file.

## 2022-10-19 ENCOUNTER — Telehealth: Payer: Self-pay | Admitting: *Deleted

## 2022-10-19 NOTE — Telephone Encounter (Signed)
Form has already been filled out and faxed back

## 2022-10-19 NOTE — Telephone Encounter (Signed)
Mark Gay w/ Dr Elza Rafter office-[phone] is faxing a form to be completed for a medical clearance for patient's procedure on tomorrow(extraction),please complete and fax back by 10/20/22.they had previously sent it on 10/16/22, please advise.

## 2022-10-26 ENCOUNTER — Ambulatory Visit: Payer: BC Managed Care – PPO | Admitting: Podiatry

## 2022-11-01 ENCOUNTER — Ambulatory Visit: Payer: BC Managed Care – PPO | Admitting: Podiatry

## 2022-11-08 ENCOUNTER — Ambulatory Visit: Payer: BC Managed Care – PPO | Admitting: Podiatry

## 2022-11-15 ENCOUNTER — Ambulatory Visit: Payer: BC Managed Care – PPO | Admitting: Podiatry

## 2022-11-30 ENCOUNTER — Ambulatory Visit: Payer: BC Managed Care – PPO | Admitting: Podiatry

## 2022-12-13 ENCOUNTER — Ambulatory Visit (INDEPENDENT_AMBULATORY_CARE_PROVIDER_SITE_OTHER): Payer: BC Managed Care – PPO

## 2022-12-13 ENCOUNTER — Ambulatory Visit (INDEPENDENT_AMBULATORY_CARE_PROVIDER_SITE_OTHER): Payer: BC Managed Care – PPO | Admitting: Podiatry

## 2022-12-13 DIAGNOSIS — M960 Pseudarthrosis after fusion or arthrodesis: Secondary | ICD-10-CM | POA: Diagnosis not present

## 2022-12-13 DIAGNOSIS — M21611 Bunion of right foot: Secondary | ICD-10-CM

## 2022-12-13 DIAGNOSIS — E559 Vitamin D deficiency, unspecified: Secondary | ICD-10-CM

## 2022-12-13 DIAGNOSIS — M2011 Hallux valgus (acquired), right foot: Secondary | ICD-10-CM

## 2022-12-15 NOTE — Progress Notes (Signed)
  Subjective:  Patient ID: Mark Gay, male    DOB: Jan 19, 1981,  MRN: [id number]  Chief Complaint  Patient presents with   Bunions    8 week follow up with new xrays     42 y.o. male returns for post-op check.  He is doing well still utilizing the bone stimulator  Review of Systems: Negative except as noted in the HPI. Denies N/V/F/Ch.   Objective:  There were no vitals filed for this visit. There is no height or weight on file to calculate BMI. Constitutional Well developed. Well nourished.  Vascular Foot warm and well perfused. Capillary refill normal to all digits.  Calf is soft and supple, no posterior calf or knee pain, negative Homans' sign  Neurologic Normal speech. Oriented to person, place, and time. Epicritic sensation to light touch grossly present bilaterally.  Dermatologic Incision is well-healed and not on hypertrophic  Orthopedic: Minimal pain in the midfoot.  There is still moderate edema.  Palpable bony spurring   Multiple view plain film radiographs: New radiographs today do show increasing consolidation across fusion site some lucency still noted Assessment:   1. Hallux valgus with bunions, right   2. Pseudarthrosis after fusion or arthrodesis   3. Vitamin D deficiency    Plan:  Patient was evaluated and treated and all questions answered.  S/p foot surgery right -Clinically he is doing much better.  He is not having any pain and swelling has reduced.  There is increasing consolidation on his radiographs.  He does still have some lucency so I recommend he continue utilizing the bone stimulator.  Vitamin D level order was placed to check this again.  I will see him back in 3 months for follow-up.  Return in about 15 weeks (around 03/28/2023) for new xrays of foot from surgery follow up .

## 2023-03-29 ENCOUNTER — Ambulatory Visit (INDEPENDENT_AMBULATORY_CARE_PROVIDER_SITE_OTHER): Payer: BC Managed Care – PPO | Admitting: Podiatry

## 2023-03-29 ENCOUNTER — Ambulatory Visit (INDEPENDENT_AMBULATORY_CARE_PROVIDER_SITE_OTHER): Payer: BC Managed Care – PPO

## 2023-03-29 ENCOUNTER — Encounter: Payer: Self-pay | Admitting: Podiatry

## 2023-03-29 DIAGNOSIS — Z9889 Other specified postprocedural states: Secondary | ICD-10-CM | POA: Diagnosis not present

## 2023-03-29 DIAGNOSIS — M960 Pseudarthrosis after fusion or arthrodesis: Secondary | ICD-10-CM | POA: Diagnosis not present

## 2023-03-29 NOTE — Progress Notes (Signed)
  Subjective:  Patient ID: Mark Gay, male    DOB: 1980-09-23,  MRN: [id number]  Chief Complaint  Patient presents with   Routine Post Op    "It's doing good.  I can wear my shoes now."     42 y.o. male returns for post-op check.  He is not having any pain.  He did not complete his lab work.  He is back at work doing most of his duties  Review of Systems: Negative except as noted in the HPI. Denies N/V/F/Ch.   Objective:  There were no vitals filed for this visit. There is no height or weight on file to calculate BMI. Constitutional Well developed. Well nourished.  Vascular Foot warm and well perfused. Capillary refill normal to all digits.  Calf is soft and supple, no posterior calf or knee pain, negative Homans' sign  Neurologic Normal speech. Oriented to person, place, and time. Epicritic sensation to light touch grossly present bilaterally.  Dermatologic Incision is well-healed and not on hypertrophic  Orthopedic: He has no pain in the midfoot.  There is still mild edema.  Palpable bony spurring   Multiple view plain film radiographs: New x-rays taken today show minimal change in fusion site since last examination, hypertrophic bone dorsal, no new complication of hardware or loss of correction Assessment:   1. Status post right foot surgery   2. Pseudarthrosis after fusion or arthrodesis    Plan:  Patient was evaluated and treated and all questions answered.  S/p foot surgery right -Still relatively asymptomatic.  He does have swelling and dorsal spurring.  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  Fortunately has minimal pain or functional impact.  If he does experience any further loss of correction or new pain then this likely will need surgical revision.  I will see him back in 3 months for new x-rays.  I will  let him know his lab work shows this was reprinted for him and he will get it done tomorrow. No follow-ups on file.

## 2023-07-01 IMAGING — DX DG FOOT COMPLETE 3+V*L*
3 series · 3 of 3 positions shown · non-contrast
Comparison: None.

CLINICAL DATA: Fall 1.5 weeks prior with persistent left first toe
bruising, swelling and pain

EXAM:
LEFT FOOT - COMPLETE 3+ VIEW

[foot supine dp]
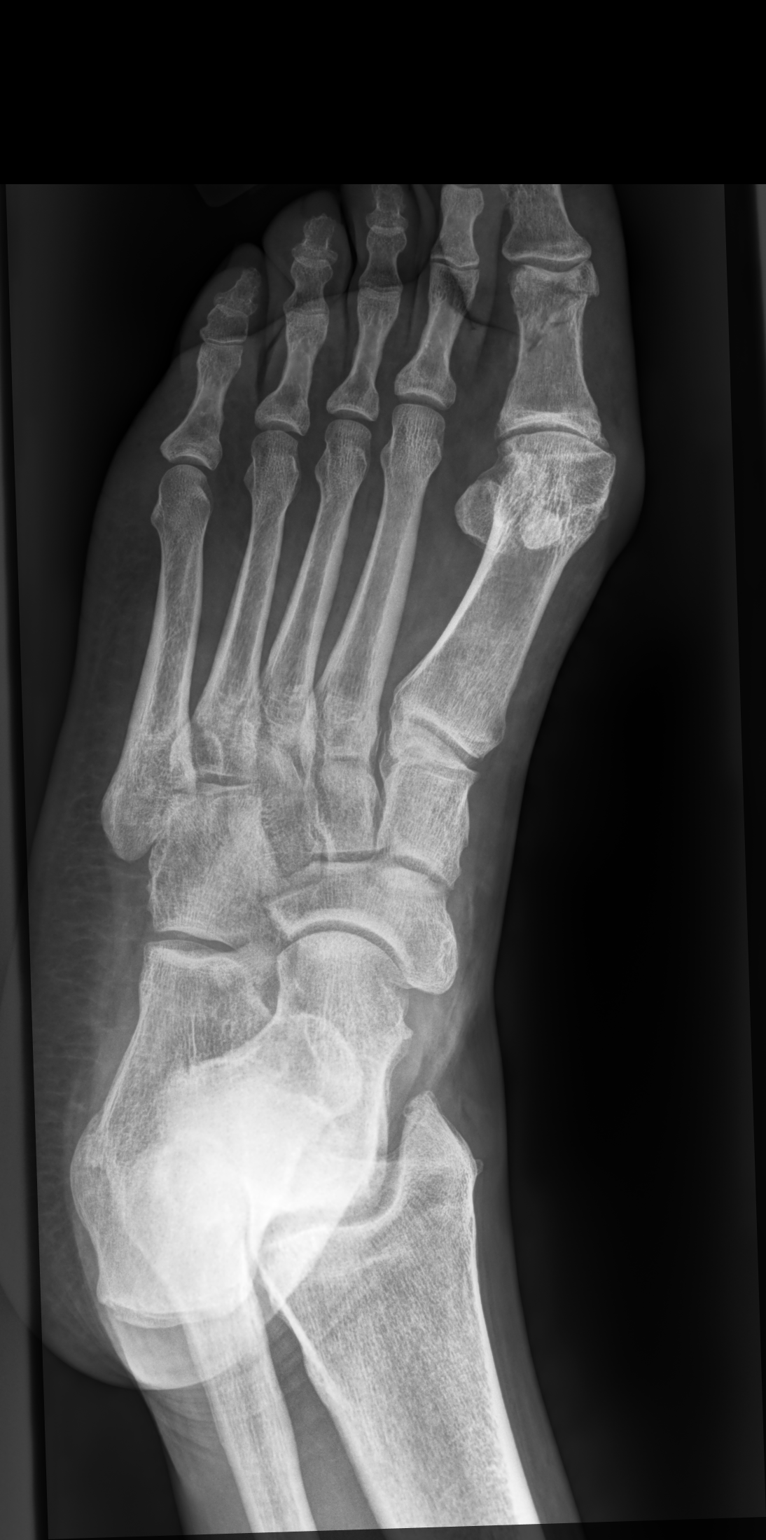

[foot medial oblique]
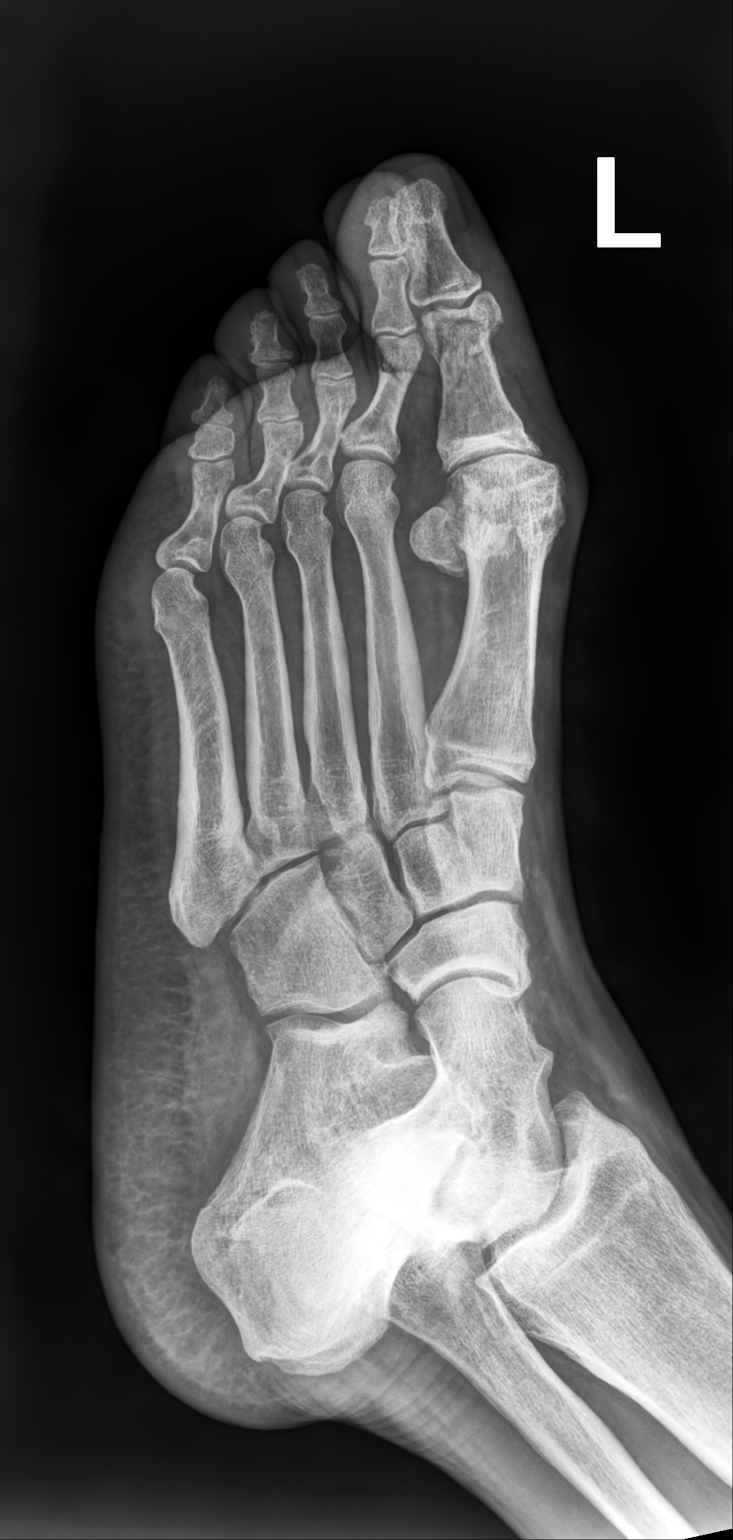

[foot supine lat]
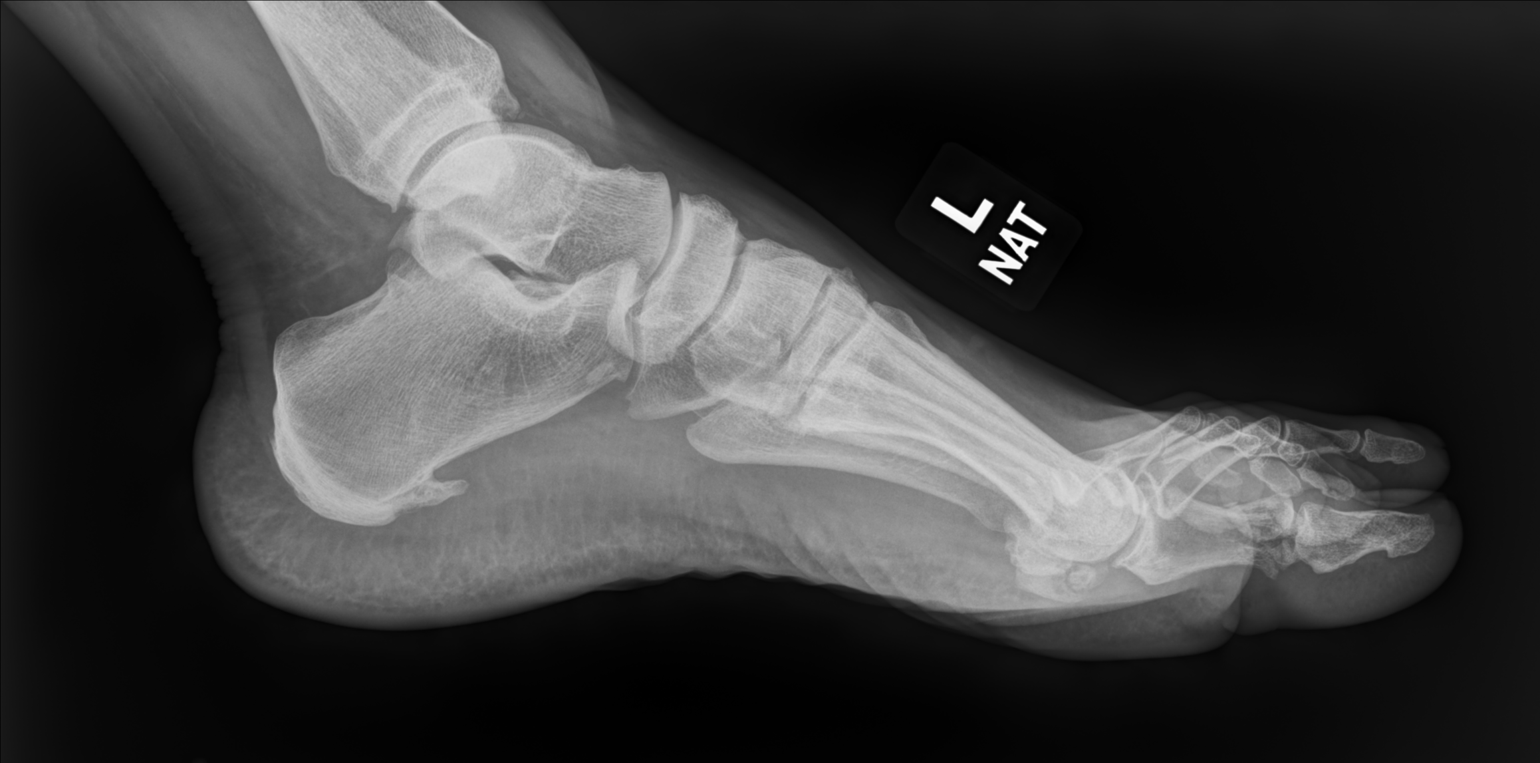

[3 of 3 positions shown; findings below may reference images not displayed]

FINDINGS: Comminuted nondisplaced intra-articular fracture of the distal
aspect of the proximal phalanx in the left first toe with
surrounding soft tissue swelling. No additional fracture. No
dislocation. Lisfranc joint appears intact. Small to moderate
plantar left calcaneal spur. Mild first MTP joint osteoarthritis. No
radiopaque foreign bodies.
IMPRESSION: 1. Comminuted nondisplaced intra-articular fracture of the distal
aspect of the proximal phalanx in the left first toe. No
dislocation.
2. Small to moderate plantar left calcaneal spur.
3. Mild first MTP joint osteoarthritis.

## 2023-07-02 ENCOUNTER — Ambulatory Visit: Payer: BC Managed Care – PPO | Admitting: Podiatry

## 2023-08-13 ENCOUNTER — Ambulatory Visit: Payer: BC Managed Care – PPO | Admitting: Podiatry

## 2023-08-30 ENCOUNTER — Encounter: Payer: Self-pay | Admitting: Podiatry

## 2023-08-30 ENCOUNTER — Ambulatory Visit (INDEPENDENT_AMBULATORY_CARE_PROVIDER_SITE_OTHER): Payer: BC Managed Care – PPO

## 2023-08-30 ENCOUNTER — Ambulatory Visit (INDEPENDENT_AMBULATORY_CARE_PROVIDER_SITE_OTHER): Payer: BC Managed Care – PPO | Admitting: Podiatry

## 2023-08-30 DIAGNOSIS — Z9889 Other specified postprocedural states: Secondary | ICD-10-CM | POA: Diagnosis not present

## 2023-08-30 DIAGNOSIS — M960 Pseudarthrosis after fusion or arthrodesis: Secondary | ICD-10-CM | POA: Diagnosis not present

## 2023-08-30 NOTE — Progress Notes (Signed)
  Subjective:  Patient ID: Mark Gay, male    DOB: 1981/03/29,  MRN: [id number]  Chief Complaint  Patient presents with   Routine Post Op      POV  DOS 02/03/2022 BUNION CORRECTION RT FOOT (LAPIDUS/AIKEN PROCEDURE), 2ND & 5TH TOE CORRECTION, POSS BONE OUT IN 2ND METATARSAL, BONE GRAFT FROM HEEL It's pretty good, way better than what it was.  I'm able to function.     43 y.o. male returns for post-op check.  He is not having any pain.  He returns for follow-up he is working where regular shoes without pain does not note much swelling  Review of Systems: Negative except as noted in the HPI. Denies N/V/F/Ch.   Objective:  There were no vitals filed for this visit. There is no height or weight on file to calculate BMI. Constitutional Well developed. Well nourished.  Vascular Foot warm and well perfused. Capillary refill normal to all digits.  Calf is soft and supple, no posterior calf or knee pain, negative Homans' sign  Neurologic Normal speech. Oriented to person, place, and time. Epicritic sensation to light touch grossly present bilaterally.  Dermatologic Incision is well-healed and not on hypertrophic  Orthopedic: He has no pain in the midfoot.  There is no soft tissue edema.  Palpable bony spurring   Multiple view plain film radiographs: New x-rays taken today show minimal change in fusion site since last examination, hypertrophic bone dorsal, no new complication of hardware or loss of correction Assessment:   1. Status post right foot surgery   2. Pseudarthrosis after fusion or arthrodesis    Plan:  Patient was evaluated and treated and all questions answered.  S/p foot surgery right -Still persisting with a asymptomatic nonunion.  He is improved from his preoperative state without his bunion pain, he does not have pain at the fusion site and the palpable bony spurring on top does not bother him.  We discussed long-term that only removal of this or revision would get  rid of this and at this point he does not want to proceed with this which I think is fine considering his lack of symptoms at the site.  He will follow-up with me as needed if he has further issues with that  Return if symptoms worsen or fail to improve.
